# Patient Record
Sex: Male | Born: 1975 | Race: Black or African American | Hispanic: No | Smoking: Current every day smoker
Health system: Southern US, Community
[De-identification: ages and names within clinical notes are randomized; demographics above are authoritative.]

## PROBLEM LIST (undated history)

## (undated) DIAGNOSIS — R011 Cardiac murmur, unspecified: Secondary | ICD-10-CM

---

## 2004-08-18 ENCOUNTER — Emergency Department: Payer: Self-pay | Admitting: Emergency Medicine

## 2005-01-12 ENCOUNTER — Emergency Department: Payer: Self-pay | Admitting: Unknown Physician Specialty

## 2007-01-22 ENCOUNTER — Emergency Department: Payer: Self-pay | Admitting: Unknown Physician Specialty

## 2007-06-06 ENCOUNTER — Emergency Department: Payer: Self-pay | Admitting: Emergency Medicine

## 2007-11-13 ENCOUNTER — Emergency Department: Payer: Self-pay | Admitting: Emergency Medicine

## 2009-11-30 IMAGING — CR DG LUMBAR SPINE 2-3V
1 series · 4 of 4 positions shown · non-contrast
Comparison: No

REASON FOR EXAM: back pain
COMMENTS:   LMP: (Male)

PROCEDURE:     DXR - DXR LUMBAR SPINE AP AND LATERAL  - November 13, 2007  [DATE]
RESULT:     History: Back pain

[Series 1: view not recorded · 0.17mm/px · 4 of 4 slices shown]
[im 1/4]
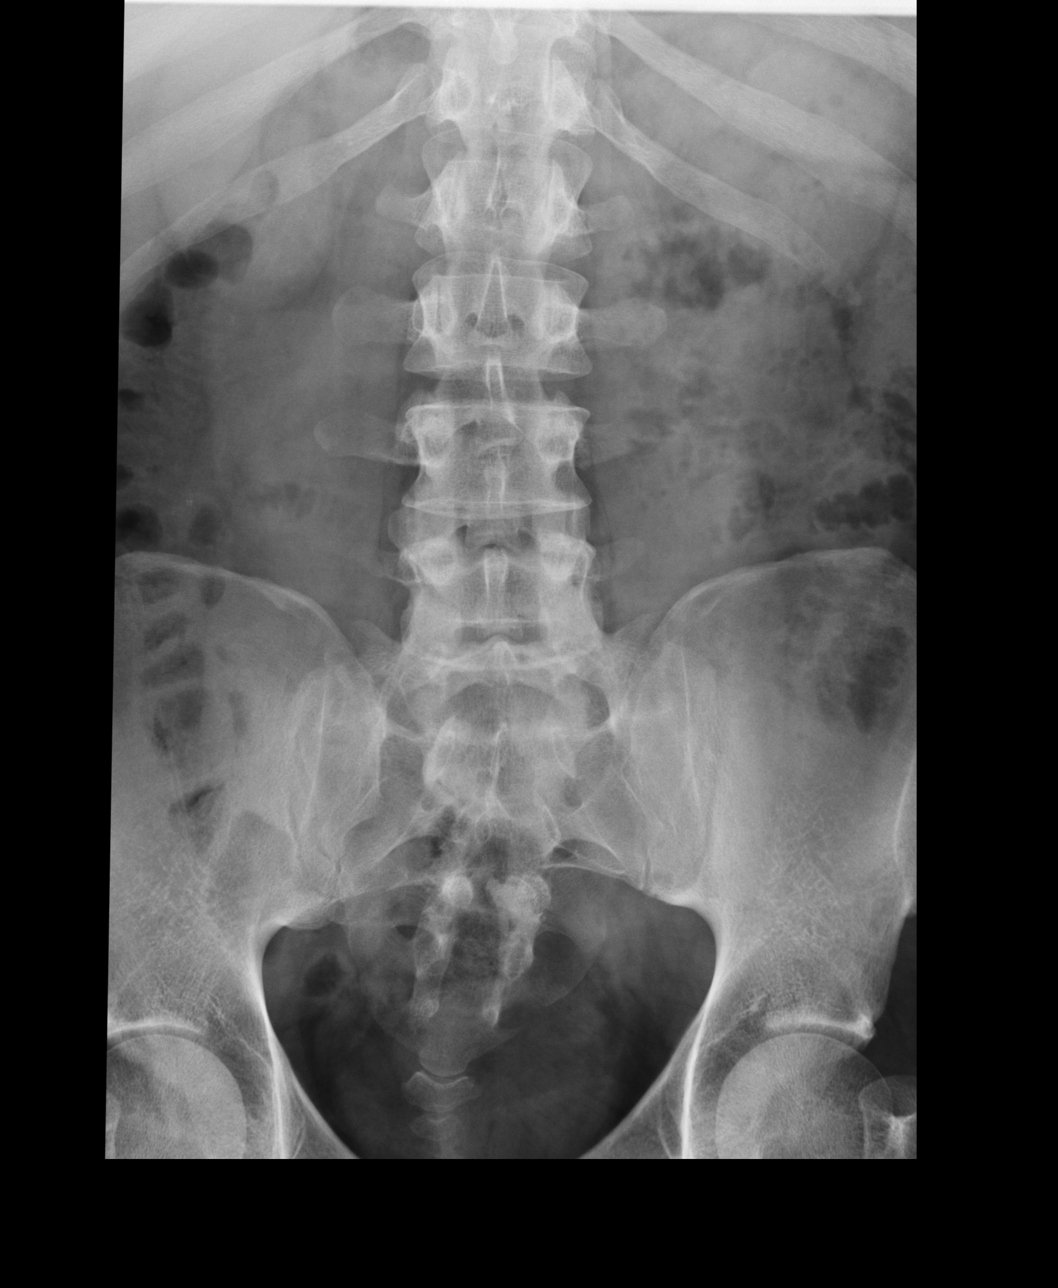
[im 2/4]
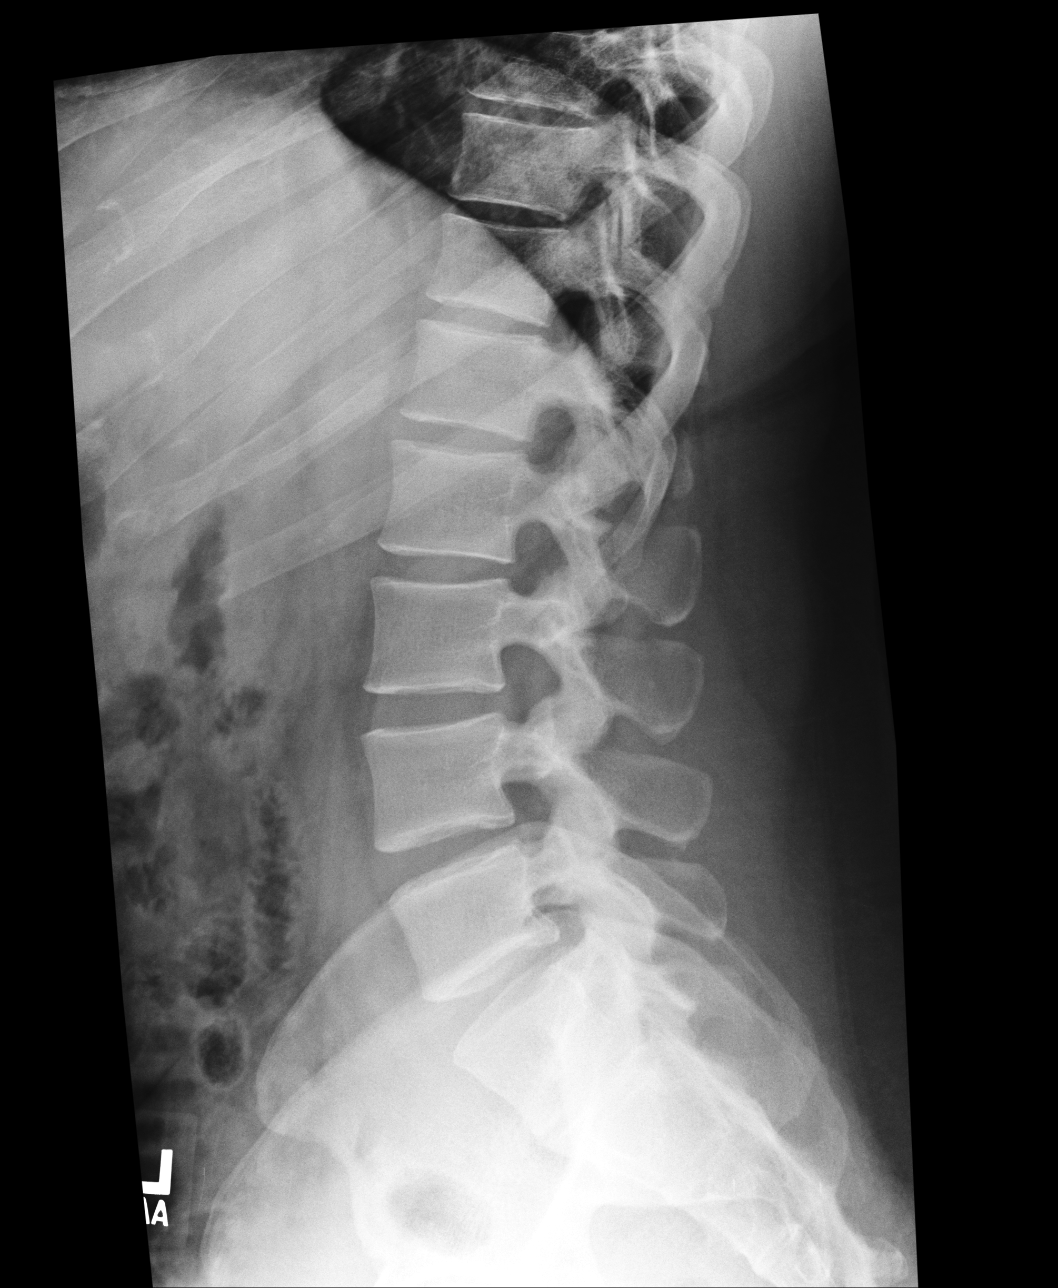
[im 3/4]
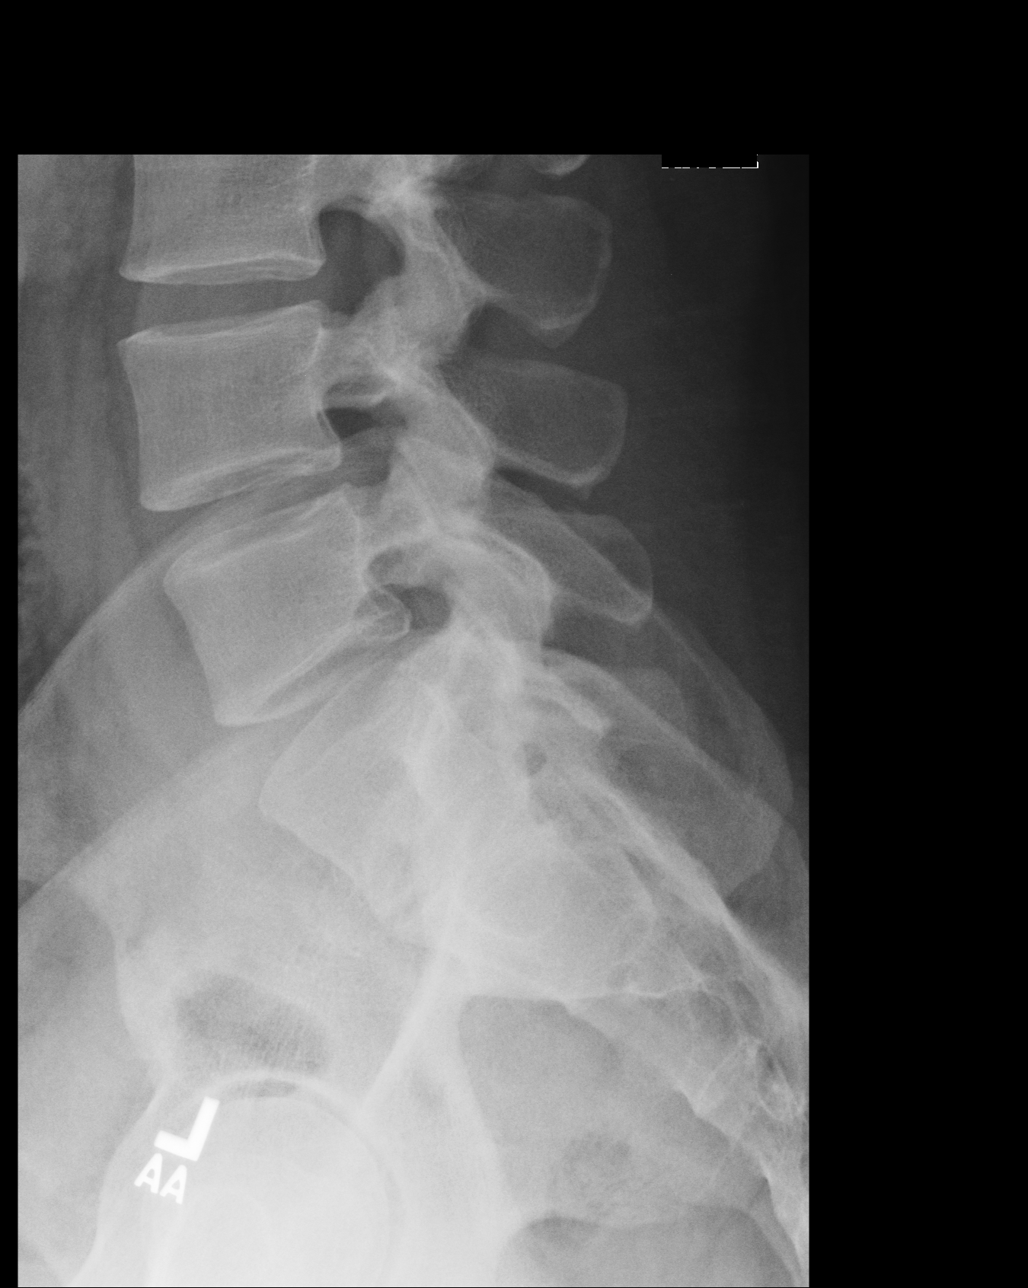
[im 4/4]
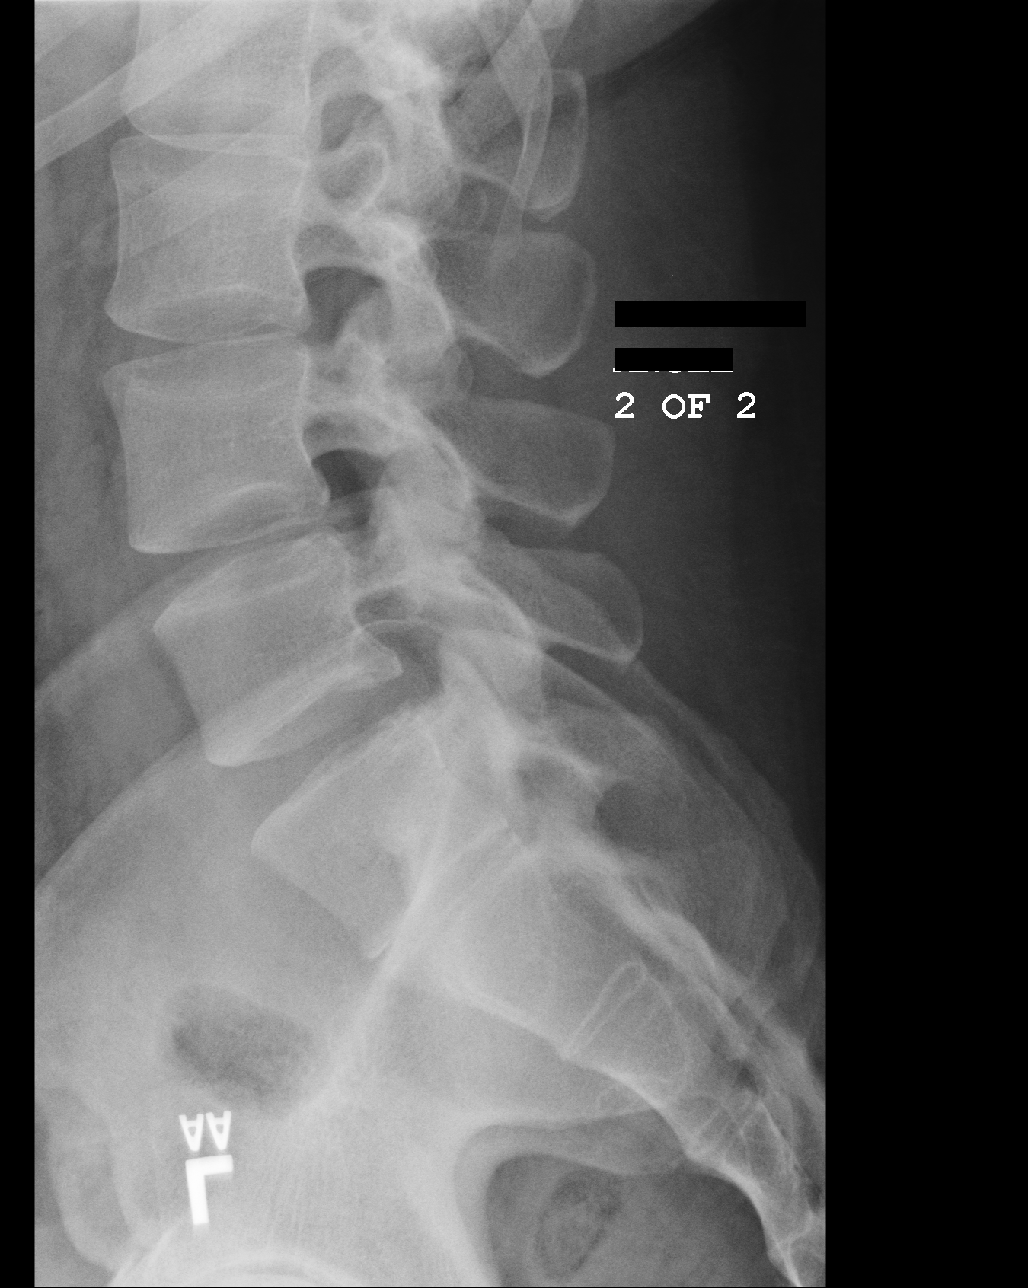

[4 of 4 positions shown; findings below may reference images not displayed]

FINDINGS: AP and lateral views of the lumbar spine and a coned down view of the
lumbosacral junction are provided.

There are 5 nonrib bearing lumbar-type vertebral bodies. The vertebral body
heights are maintained. The alignment is anatomic. There is no
spondylolysis. There is no acute fracture or static listhesis. The disc
spaces are maintained.

The SI joints are unremarkable.
IMPRESSION: 1. No acute osseous abnormality of the lumbar spine

## 2012-04-23 ENCOUNTER — Emergency Department: Payer: Self-pay | Admitting: Emergency Medicine

## 2015-11-26 ENCOUNTER — Encounter: Payer: Self-pay | Admitting: *Deleted

## 2015-11-26 ENCOUNTER — Emergency Department
Admission: EM | Admit: 2015-11-26 | Discharge: 2015-11-26 | Disposition: A | Discharge status: Home / Self Care | Payer: Self-pay | Attending: Emergency Medicine | Admitting: Emergency Medicine

## 2015-11-26 DIAGNOSIS — F17200 Nicotine dependence, unspecified, uncomplicated: Secondary | ICD-10-CM | POA: Insufficient documentation

## 2015-11-26 DIAGNOSIS — R300 Dysuria: Secondary | ICD-10-CM

## 2015-11-26 DIAGNOSIS — N342 Other urethritis: Secondary | ICD-10-CM | POA: Insufficient documentation

## 2015-11-26 HISTORY — DX: Cardiac murmur, unspecified: R01.1

## 2015-11-26 LAB — URINALYSIS COMPLETE WITH MICROSCOPIC (ARMC ONLY)
BACTERIA UA: NONE SEEN
Bilirubin Urine: NEGATIVE
GLUCOSE, UA: NEGATIVE mg/dL
HGB URINE DIPSTICK: NEGATIVE
Ketones, ur: NEGATIVE mg/dL
LEUKOCYTES UA: NEGATIVE
NITRITE: NEGATIVE
PROTEIN: NEGATIVE mg/dL
SPECIFIC GRAVITY, URINE: 1.021 (ref 1.005–1.030)
pH: 6 (ref 5.0–8.0)

## 2015-11-26 LAB — CHLAMYDIA/NGC RT PCR (ARMC ONLY)
CHLAMYDIA TR: NOT DETECTED
N gonorrhoeae: NOT DETECTED

## 2015-11-26 MED ORDER — METRONIDAZOLE 500 MG PO TABS: 2000.0000 mg | ORAL_TABLET | Freq: Once | ORAL | 0 refills | Status: AC

## 2015-11-26 MED ORDER — AZITHROMYCIN 1 G PO PACK
1.0000 g | PACK | Freq: Once | ORAL | Status: AC
  Administered 2015-11-26: 1 g via ORAL
  Filled 2015-11-26: qty 1

## 2015-11-26 MED ORDER — CEFTRIAXONE SODIUM 250 MG IJ SOLR
250.0000 mg | Freq: Once | INTRAMUSCULAR | Status: AC
  Administered 2015-11-26: 250 mg via INTRAMUSCULAR
  Filled 2015-11-26: qty 250

## 2015-11-26 MED ORDER — LIDOCAINE HCL (PF) 1 % IJ SOLN
INTRAMUSCULAR | Status: AC
  Filled 2015-11-26: qty 5

## 2015-11-26 NOTE — ED Triage Notes (Signed)
Patient states he noticed a burning sensation after he finishes voiding for two days.

## 2015-11-26 NOTE — ED Provider Notes (Signed)
[facility] Emergency Department Provider Note ____________________________________________  Time seen: 1400  I have reviewed the triage vital signs and the nursing notes.  HISTORY  Chief Complaint  Urinary Tract Infection  HPI Miguel Wilkerson is a 40 y.o. male presents to the ED for evaluation of dysuria over the last 2 days. Patient describes a burning sensation after he voids for the last 2 days. He denies any interim fevers, chills, sweats. Denies any penile discharge, hematuria, or penile lesions.He does report an unprotected sexual encounter 2 days prior. He denies any previous history of STDs. He also denies any kidney stones or prostatitis.  Past Medical History:  Diagnosis Date  . Heart murmur     There are no active problems to display for this patient.  History reviewed. No pertinent surgical history.  Prior to Admission medications   Medication Sig Start Date End Date Taking? Authorizing Provider  metroNIDAZOLE (FLAGYL) 500 MG tablet Take 4 tablets (2,000 mg total) by mouth once. 11/26/15 11/26/15  Eithen Castiglia V Bacon Mariem Skolnick, PA-C   Allergies Ampicillin  No family history on file.  Social History Social History  Substance Use Topics  . Smoking status: Current Every Day Smoker  . Smokeless tobacco: Never Used  . Alcohol use Yes     Comment: drinks two beers every day after work   Review of Systems  Constitutional: Negative for fever. Cardiovascular: Negative for chest pain. Respiratory: Negative for shortness of breath. Gastrointestinal: Negative for abdominal pain, vomiting and diarrhea. Genitourinary: Positive for dysuria. Denies discharge or hematuria Musculoskeletal: Negative for back pain. Skin: Negative for rash. ____________________________________________  PHYSICAL EXAM:  VITAL SIGNS: ED Triage Vitals  Enc Vitals Group     BP 11/26/15 1318 135/83     Pulse Rate 11/26/15 1318 66     Resp 11/26/15 1318 16     Temp 11/26/15  1318 98 F (36.7 C)     Temp Source 11/26/15 1318 Oral     SpO2 11/26/15 1318 99 %     Weight 11/26/15 1319 235 lb (106.6 kg)     Height 11/26/15 1319 6\' 3"  (1.905 m)     Head Circumference --      Peak Flow --      Pain Score 11/26/15 1319 6     Pain Loc --      Pain Edu? --      Excl. in GC? --     Constitutional: Alert and oriented. Well appearing and in no distress. Head: Normocephalic and atraumatic. Cardiovascular: Normal rate, regular rhythm.  Respiratory: Normal respiratory effort. No wheezes/rales/rhonchi. Gastrointestinal: Soft and nontender. No distention, Rebound, guarding, organomegaly. Mild tenderness to palpation over the lower abdominal pelvic region.  GU: deferred. No palpable inguinal lymph nodes. Musculoskeletal: Nontender with normal range of motion in all extremities.  ____________________________________________   LABS (pertinent positives/negatives) Labs Reviewed  URINALYSIS COMPLETEWITH MICROSCOPIC ([facility] ONLY) - Abnormal; Notable for the following:       Result Value   Color, Urine YELLOW (*)    APPearance CLEAR (*)    Squamous Epithelial / LPF 0-5 (*)    All other components within normal limits  CHLAMYDIA/NGC RT PCR ([facility] ONLY)  _____________________________________________  PROCEDURES  Azithromycin 1 g PO Rocephin 250 mg IM ____________________________________________  INITIAL IMPRESSION / ASSESSMENT AND PLAN / ED COURSE  Patient with an acute dysuria and likely secondary to ureteritis. Patient treated empirically for gonorrhea and Chlamydia and is discharged with a prescription for metronidazole to dose for  trichomoniasis. He will [facility] ongoing symptom management. GC results are pending at the time of discharge.  Clinical Course   ____________________________________________  FINAL CLINICAL IMPRESSION(S) / ED DIAGNOSES  Final diagnoses:  Dysuria  Urethritis      Lissa HoardJenise  V Bacon Caylea Foronda, PA-C 11/26/15 1533    Phineas SemenGraydon Goodman, MD 11/26/15 1535

## 2015-11-26 NOTE — Discharge Instructions (Signed)
You are being treated for gonorrhea & chlamydia. You may call back for test results as discussed. Take the antibiotic prescribed, for potential trichomoniasis, as directed. Avoid sexual contact for the next week. Make sure all sexual partners are treated. Follow-up with the [facility] for further treatment.

## 2015-12-16 MED ORDER — TETANUS-DIPHTH-ACELL PERTUSSIS 5-2.5-18.5 LF-MCG/0.5 IM SUSP
INTRAMUSCULAR | Status: AC
  Filled 2015-12-16: qty 0.5

## 2016-01-05 DIAGNOSIS — S62614A Displaced fracture of proximal phalanx of right ring finger, initial encounter for closed fracture: Secondary | ICD-10-CM | POA: Insufficient documentation

## 2016-01-05 DIAGNOSIS — W11XXXA Fall on and from ladder, initial encounter: Secondary | ICD-10-CM | POA: Insufficient documentation

## 2016-01-05 DIAGNOSIS — Y929 Unspecified place or not applicable: Secondary | ICD-10-CM | POA: Diagnosis not present

## 2016-01-05 DIAGNOSIS — Y990 Civilian activity done for income or pay: Secondary | ICD-10-CM | POA: Insufficient documentation

## 2016-01-05 DIAGNOSIS — F17210 Nicotine dependence, cigarettes, uncomplicated: Secondary | ICD-10-CM | POA: Diagnosis not present

## 2016-01-05 DIAGNOSIS — Y939 Activity, unspecified: Secondary | ICD-10-CM | POA: Insufficient documentation

## 2016-01-05 DIAGNOSIS — S6991XA Unspecified injury of right wrist, hand and finger(s), initial encounter: Secondary | ICD-10-CM | POA: Diagnosis present

## 2016-01-05 NOTE — ED Triage Notes (Signed)
Pt fell at work injuring right hand, swelling noted.

## 2016-01-06 ENCOUNTER — Emergency Department: Payer: Worker's Compensation

## 2016-01-06 ENCOUNTER — Emergency Department
Admission: EM | Admit: 2016-01-06 | Discharge: 2016-01-06 | Disposition: A | Discharge status: Home / Self Care | Payer: Worker's Compensation | Attending: Emergency Medicine | Admitting: Emergency Medicine

## 2016-01-06 ENCOUNTER — Encounter: Payer: Self-pay | Admitting: Emergency Medicine

## 2016-01-06 DIAGNOSIS — S62614A Displaced fracture of proximal phalanx of right ring finger, initial encounter for closed fracture: Secondary | ICD-10-CM

## 2016-01-06 DIAGNOSIS — M79641 Pain in right hand: Secondary | ICD-10-CM

## 2016-01-06 MED ORDER — OXYCODONE-ACETAMINOPHEN 5-325 MG PO TABS
1.0000 | ORAL_TABLET | Freq: Once | ORAL | Status: AC
  Administered 2016-01-06: 1 via ORAL
  Filled 2016-01-06: qty 1

## 2016-01-06 MED ORDER — OXYCODONE-ACETAMINOPHEN 5-325 MG PO TABS: 1.0000 | ORAL_TABLET | Freq: Four times a day (QID) | ORAL | 0 refills | Status: AC | PRN

## 2016-01-06 NOTE — ED Notes (Signed)
Spoke with pt's supervisor, Lolita RiegerEd Stabler, who states pt does not need to have urine drug screen for WC performed

## 2016-01-06 NOTE — ED Notes (Signed)
Discharge instructions reviewed with patient. Questions fielded by this RN. Patient verbalizes understanding of instructions. Patient discharged home in stable condition per Malinda MD . No acute distress noted at time of discharge.   

## 2016-01-06 NOTE — ED Provider Notes (Signed)
[facility]-EMERGENCY DEPARTMENT Provider Note   CSN: [id number] Arrival date & time: 01/05/16  2351     History   Chief Complaint Chief Complaint  Patient presents with  . Hand Pain    HPI Miguel Wilkerson is a 40 y.o. male.Patient reports he slipped and grabbed the ladder to try to stop himself from falling but the latter one went away and he went the other he says. He fell and now has pain in the right hand at the base of the fourth finger.  HPI  Past Medical History:  Diagnosis Date  . Heart murmur     There are no active problems to display for this patient.   History reviewed. No pertinent surgical history.     Home Medications    Prior to Admission medications   Medication Sig Start Date End Date Taking? Authorizing Provider  oxyCODONE-acetaminophen (ROXICET) 5-325 MG tablet Take 1 tablet by mouth every 6 (six) hours as needed. 01/06/16 01/05/17  Arnaldo Natal, MD    Family History History reviewed. No pertinent family history.  Social History Social History  Substance Use Topics  . Smoking status: Current Every Day Smoker    Types: Cigarettes  . Smokeless tobacco: Never Used  . Alcohol use Yes     Comment: drinks two beers every day after work     Allergies   Ampicillin   Review of Systems Review of Systems Review of systems is negative for any other injury. I Cannot get the computer to put out the template for review of systems he has no head injury and no neck pain no chest pain no back pain no belly pain and no pain in any other extremity except for his hand.  Physical Exam Updated Vital Signs BP 130/75   Pulse 85   Temp 98 F (36.7 C) (Oral)   Resp 18   Ht 6\' 3"  (1.905 m)   Wt 236 lb (107 kg)   SpO2 98%   BMI 29.50 kg/m   Physical Exam This physical exam template is also missing however patient is awake alert oriented 3 and in no acute distress his head is normal nontraumatic his eyes are normal normal pupils extraocular movements  intact mouth looks normal active supple hand shows some swelling and tenderness in the area of the ring finger on the right side. Finger has normal capillary refill and sensation but is very tender especially at the metacarpal joint.   ED Treatments / Results  Labs (all labs ordered are listed, but only abnormal results are displayed) Labs Reviewed - No data to display  EKG  EKG Interpretation None       Radiology Dg Hand Complete Right  Result Date: 01/06/2016 CLINICAL DATA:  Right hand pain after injury.  Fall at work. EXAM: RIGHT HAND - COMPLETE 3+ VIEW COMPARISON:  None. FINDINGS: Acute minimally displaced fracture of the fourth proximal phalanx radial aspect with probable extension to the metacarpal phalangeal joint. Associated soft tissue edema. There is a remote healed fracture of the fifth metacarpal. No radiopaque foreign body. IMPRESSION: Acute minimally displaced fourth proximal phalanx fracture, likely extending to the metacarpal phalangeal joint. Electronically Signed   By: Rubye Oaks M.D.   On: 01/06/2016 00:47    Procedures Procedures (including critical care time)  Medications Ordered in ED Medications  oxyCODONE-acetaminophen (PERCOCET/ROXICET) 5-325 MG per tablet 1 tablet (1 tablet Oral Given 01/06/16 0214)     Initial Impression / Assessment and Plan / ED Course  I  have reviewed the triage vital signs and the nursing notes.  Pertinent labs & imaging results that were available during my care of the patient were reviewed by me and considered in my medical decision making (see chart for details).  Clinical Course   Discussed with Dr. Trilby DrummerManns he will follow-up in the office request Buddy tape and splint.   Final Clinical Impressions(s) / ED Diagnoses   Final diagnoses:  Right hand pain  Closed displaced fracture of proximal phalanx of right ring finger, initial encounter    New Prescriptions Discharge Medication List as of 01/06/2016  2:56 AM      START taking these medications   Details  oxyCODONE-acetaminophen (ROXICET) 5-325 MG tablet Take 1 tablet by mouth every 6 (six) hours as needed., Starting Thu 01/06/2016, Until Fri 01/05/2017, Print         Arnaldo NatalPaul F Malinda, MD 01/06/16 [phone]

## 2016-01-06 NOTE — ED Notes (Signed)
Dr. Malinda at bedside.  

## 2016-01-06 NOTE — Discharge Instructions (Addendum)
Please wear the splint. Keep your finger elevated as much as possible. Call Dr. Rosita KeaMENZ the orthopedic surgeon in the morning. He will follow-up with you in the office. Make sure to follow up with him because your finger is slightly crooked from the fracture. He will need to make sure that is fixed.  You can take Percocet one pill 4 times a day as needed for pain. Be carefully can make you sleepy. Do not drive on it. It can also make you constipated. Drink plenty of fluids and eat lots of fiber.

## 2019-06-26 ENCOUNTER — Other Ambulatory Visit: Payer: Self-pay

## 2019-06-26 ENCOUNTER — Emergency Department
Admission: EM | Admit: 2019-06-26 | Discharge: 2019-06-26 | Disposition: A | Discharge status: Home / Self Care | Payer: Self-pay | Attending: Emergency Medicine | Admitting: Emergency Medicine

## 2019-06-26 DIAGNOSIS — F17210 Nicotine dependence, cigarettes, uncomplicated: Secondary | ICD-10-CM | POA: Insufficient documentation

## 2019-06-26 DIAGNOSIS — R300 Dysuria: Secondary | ICD-10-CM | POA: Insufficient documentation

## 2019-06-26 LAB — URINALYSIS, COMPLETE (UACMP) WITH MICROSCOPIC
Bacteria, UA: NONE SEEN
Bilirubin Urine: NEGATIVE
Glucose, UA: NEGATIVE mg/dL
Hgb urine dipstick: NEGATIVE
Ketones, ur: NEGATIVE mg/dL
Leukocytes,Ua: NEGATIVE
Nitrite: NEGATIVE
Protein, ur: NEGATIVE mg/dL
Specific Gravity, Urine: 1.013 (ref 1.005–1.030)
pH: 6 (ref 5.0–8.0)

## 2019-06-26 NOTE — ED Notes (Addendum)
Pt given ice water as requested.  Pt expressing frustration with wait time and states he is leaving, Darl Pikes PA made aware  Unable to provide d/c teaching/instructions or attain discharge signature

## 2019-06-26 NOTE — ED Notes (Signed)
Pt states having pain that went from his lower abdomin that now is in his penis. Pt confirms painful urination, frequent urination, and a foul smell to his urine.

## 2019-06-26 NOTE — ED Triage Notes (Signed)
Pt comes via POV from home with c/o penile discharge and pain that started 2 days ago.  Pt denies any recent STD exposure. Pt states burning with urination after.

## 2019-06-26 NOTE — ED Provider Notes (Signed)
[facility] Emergency Department Provider Note  ____________________________________________   First MD Initiated Contact with Patient 06/26/19 1502     (approximate)  I have reviewed the triage vital signs and the nursing notes.   HISTORY  Chief Complaint penile discharge    HPI Kenneth Cuaresma is a 44 y.o. male presents emergency department with concerns of painful urination and foul smell to his urine.  Patient is concerned about STD.  Has had unprotected sex.  No fever or chills.  No penile discharge.    Past Medical History:  Diagnosis Date  . Heart murmur     There are no problems to display for this patient.   History reviewed. No pertinent surgical history.  Prior to Admission medications   Not on File    Allergies Ampicillin  No family history on file.  Social History Social History   Tobacco Use  . Smoking status: Current Every Day Smoker    Types: Cigarettes  . Smokeless tobacco: Never Used  Substance Use Topics  . Alcohol use: Yes    Comment: drinks two beers every day after work  . Drug use: Yes    Types: Marijuana    Comment: occasionally    Review of Systems  Constitutional: No fever/chills Eyes: No visual changes. ENT: No sore throat. Respiratory: Denies cough Cardiovascular: Denies chest pain Gastrointestinal: Denies abdominal pain Genitourinary: Positive for dysuria. Musculoskeletal: Negative for back pain. Skin: Negative for rash. Psychiatric: no mood changes,     ____________________________________________   PHYSICAL EXAM:  VITAL SIGNS: ED Triage Vitals  Enc Vitals Group     BP 06/26/19 1252 128/75     Pulse Rate 06/26/19 1252 75     Resp 06/26/19 1252 18     Temp 06/26/19 1252 98 F (36.7 C)     Temp src --      SpO2 06/26/19 1252 100 %     Weight 06/26/19 1249 251 lb (113.9 kg)     Height 06/26/19 1249 6\' 3"  (1.905 m)     Head Circumference --      Peak Flow --      Pain Score  06/26/19 1249 6     Pain Loc --      Pain Edu? --      Excl. in [place]? --     Constitutional: Alert and oriented. Well appearing and in no acute distress. Eyes: Conjunctivae are normal.  Head: Atraumatic. Nose: No congestion/rhinnorhea. Mouth/Throat: Mucous membranes are moist.   Neck:  supple no lymphadenopathy noted Cardiovascular: Normal rate, regular rhythm. Respiratory: Normal respiratory effort.  No retractions GU: deferred by the patient Musculoskeletal: FROM all extremities, warm and well perfused Neurologic:  Normal speech and language.  Skin:  Skin is warm, dry and intact. No rash noted. Psychiatric: Mood and affect are normal. Speech and behavior are normal.  ____________________________________________   LABS (all labs ordered are listed, but only abnormal results are displayed)  Labs Reviewed  URINALYSIS, COMPLETE (UACMP) WITH MICROSCOPIC - Abnormal; Notable for the following components:      Result Value   Color, Urine YELLOW (*)    APPearance CLEAR (*)    All other components within normal limits  CHLAMYDIA/NGC RT PCR ([facility] ONLY)   ____________________________________________   ____________________________________________  RADIOLOGY    ____________________________________________   PROCEDURES  Procedure(s) performed: No  Procedures    ____________________________________________   INITIAL IMPRESSION / ASSESSMENT AND PLAN / ED COURSE  Pertinent labs & imaging results that  were available during my care of the patient were reviewed by me and considered in my medical decision making (see chart for details).   Patient is a 44 year old male presents emergency department stating some burning with urination. Concerns of STD  Physical exam is unremarkable patient is refusing examination of the genitalia.  When I explained to him that he will need another urine sample to test for GC/chlamydia he became upset due to the fact that he had  already given 1 urine.  He states I am not going to wait around here for this.  I explained to him he can always follow-up with the health department which would be absolutely appropriate for his STDs.  In fact I explained to him following up with the health department would be more appropriate.  Was instructed to follow-up with [facility] department.  He did not wait to have another urinalysis done it told the nurse he was leaving.    Rahkeem Senft was evaluated in Emergency Department on 06/26/2019 for the symptoms described in the history of present illness. He was evaluated in the context of the global COVID-19 pandemic, which necessitated consideration that the patient might be at risk for infection with the SARS-CoV-2 virus that causes COVID-19. Institutional protocols and algorithms that pertain to the evaluation of patients at risk for COVID-19 are in a state of rapid change based on information released by regulatory bodies including the CDC and federal and state organizations. These policies and algorithms were followed during the patient's care in the ED.   As part of my medical decision making, I reviewed the following data within the electronic MEDICAL RECORD NUMBER Nursing notes reviewed and incorporated, Labs reviewed UA is normal, Notes from prior ED visits and [place] Controlled Substance Database  ____________________________________________   FINAL CLINICAL IMPRESSION(S) / ED DIAGNOSES  Final diagnoses:  Dysuria      NEW MEDICATIONS STARTED DURING THIS VISIT:  There are no discharge medications for this patient.    Note:  This document was prepared using Dragon voice recognition software and may include unintentional dictation errors.    Faythe Ghee, PA-C 06/26/19 1610    Emily Filbert, MD 06/26/19 [phone]

## 2019-11-12 ENCOUNTER — Emergency Department: Payer: Self-pay

## 2019-11-12 ENCOUNTER — Other Ambulatory Visit: Payer: Self-pay

## 2019-11-12 DIAGNOSIS — Z5321 Procedure and treatment not carried out due to patient leaving prior to being seen by health care provider: Secondary | ICD-10-CM | POA: Insufficient documentation

## 2019-11-12 DIAGNOSIS — R0789 Other chest pain: Secondary | ICD-10-CM | POA: Insufficient documentation

## 2019-11-12 LAB — CBC
HCT: 43.9 % (ref 39.0–52.0)
Hemoglobin: 16.1 g/dL (ref 13.0–17.0)
MCH: 33.9 pg (ref 26.0–34.0)
MCHC: 36.7 g/dL — ABNORMAL HIGH (ref 30.0–36.0)
MCV: 92.4 fL (ref 80.0–100.0)
Platelets: 311 10*3/uL (ref 150–400)
RBC: 4.75 MIL/uL (ref 4.22–5.81)
RDW: 11.6 % (ref 11.5–15.5)
WBC: 10.3 10*3/uL (ref 4.0–10.5)
nRBC: 0 % (ref 0.0–0.2)

## 2019-11-12 LAB — ETHANOL: Alcohol, Ethyl (B): 124 mg/dL — ABNORMAL HIGH (ref <10)

## 2019-11-12 NOTE — ED Triage Notes (Signed)
Pt in with co left sided chest pain that started yesterday, denies any hx of heart disease or trauma. Denies any drug use has been drinking. Pt very restless in triage and diaphoretic.

## 2019-11-13 ENCOUNTER — Emergency Department
Admission: EM | Admit: 2019-11-13 | Discharge: 2019-11-13 | Disposition: A | Discharge status: Home / Self Care | Payer: Self-pay | Attending: Emergency Medicine | Admitting: Emergency Medicine

## 2019-11-13 LAB — COMPREHENSIVE METABOLIC PANEL
ALT: 18 U/L (ref 0–44)
AST: 25 U/L (ref 15–41)
Albumin: 5.2 g/dL — ABNORMAL HIGH (ref 3.5–5.0)
Alkaline Phosphatase: 59 U/L (ref 38–126)
Anion gap: 20 — ABNORMAL HIGH (ref 5–15)
BUN: 10 mg/dL (ref 6–20)
CO2: 20 mmol/L — ABNORMAL LOW (ref 22–32)
Calcium: 10.2 mg/dL (ref 8.9–10.3)
Chloride: 95 mmol/L — ABNORMAL LOW (ref 98–111)
Creatinine, Ser: 1.26 mg/dL — ABNORMAL HIGH (ref 0.61–1.24)
GFR calc Af Amer: >60 mL/min (ref >60)
GFR calc non Af Amer: >60 mL/min (ref >60)
Glucose, Bld: 88 mg/dL (ref 70–99)
Potassium: 3.3 mmol/L — ABNORMAL LOW (ref 3.5–5.1)
Sodium: 135 mmol/L (ref 135–145)
Total Bilirubin: 1.3 mg/dL — ABNORMAL HIGH (ref 0.3–1.2)
Total Protein: 8.9 g/dL — ABNORMAL HIGH (ref 6.5–8.1)

## 2019-11-13 LAB — LIPASE, BLOOD: Lipase: 38 U/L (ref 11–51)

## 2019-11-13 LAB — TROPONIN I (HIGH SENSITIVITY): Troponin I (High Sensitivity): 5 ng/L (ref <18)

## 2022-06-29 ENCOUNTER — Ambulatory Visit: Payer: Self-pay
